# Patient Record
Sex: Female | Born: 1990 | Hispanic: Yes | Marital: Single
Health system: Southern US, Community
[De-identification: ages and names within clinical notes are randomized; demographics above are authoritative.]

---

## 2016-11-18 ENCOUNTER — Ambulatory Visit (INDEPENDENT_AMBULATORY_CARE_PROVIDER_SITE_OTHER): Payer: Self-pay | Admitting: General Practice

## 2016-11-18 DIAGNOSIS — O208 Other hemorrhage in early pregnancy: Principal | ICD-10-CM

## 2016-11-18 DIAGNOSIS — O209 Hemorrhage in early pregnancy, unspecified: Secondary | ICD-10-CM

## 2016-11-18 DIAGNOSIS — Z3201 Encounter for pregnancy test, result positive: Secondary | ICD-10-CM

## 2016-11-18 LAB — POCT PREGNANCY, URINE: PREG TEST UR: POSITIVE — AB

## 2016-11-18 NOTE — Progress Notes (Signed)
Patient here for UPT today. UPT +. Patient reports first positive home test in August. Patient reports starting period 11/13/16. Per chart review, [facility] for [facility]. Patient left AMA prior to being seen. Patient does not seem to be accurate historian when questioned about menstrual history. Spoke with Dr Adrian BlackwaterStinson who recommends bhcg today and Thursday, does not need to be stat. Discussed with patient. Patient verbalized understanding & had no questions

## 2016-11-19 LAB — BETA HCG QUANT (REF LAB): HCG QUANT: 57 m[IU]/mL

## 2016-11-20 ENCOUNTER — Ambulatory Visit: Payer: Self-pay | Admitting: General Practice

## 2016-11-20 DIAGNOSIS — O209 Hemorrhage in early pregnancy, unspecified: Secondary | ICD-10-CM

## 2016-11-21 ENCOUNTER — Telehealth: Payer: Self-pay | Admitting: General Practice

## 2016-11-21 DIAGNOSIS — O3680X0 Pregnancy with inconclusive fetal viability, not applicable or unspecified: Secondary | ICD-10-CM

## 2016-11-21 LAB — BETA HCG QUANT (REF LAB): HCG QUANT: 66 m[IU]/mL

## 2016-11-21 NOTE — Telephone Encounter (Signed)
-----   Message from Levie HeritageJacob J Stinson, DO sent at 11/21/2016 12:46 PM EDT ----- Please schedule for US - inappropriately rising quants, possible nonviable pregnancy vs retained products?

## 2016-11-21 NOTE — Telephone Encounter (Signed)
Scheduled ultrasound for 11/1 @ 1pm. Called patient, no answer- left message stating we are trying to reach you regarding results & an appt scheduled for next Thursday at 1pm please call us back. Also left same message on emergency contact's voicemail. Will send letter

## 2016-11-25 ENCOUNTER — Telehealth: Payer: Self-pay | Admitting: *Deleted

## 2016-11-25 NOTE — Telephone Encounter (Signed)
Patient left 3 messages requesting her test results.

## 2016-11-27 ENCOUNTER — Ambulatory Visit (HOSPITAL_COMMUNITY): Payer: Self-pay

## 2016-11-27 ENCOUNTER — Ambulatory Visit (HOSPITAL_COMMUNITY)
Admission: RE | Admit: 2016-11-27 | Discharge: 2016-11-27 | Disposition: A | Discharge status: Home / Self Care | Payer: Self-pay | Source: Ambulatory Visit | Attending: Family Medicine | Admitting: Family Medicine

## 2016-11-27 ENCOUNTER — Ambulatory Visit: Payer: Self-pay

## 2016-11-27 DIAGNOSIS — Z3490 Encounter for supervision of normal pregnancy, unspecified, unspecified trimester: Secondary | ICD-10-CM | POA: Insufficient documentation

## 2016-11-27 DIAGNOSIS — O3680X0 Pregnancy with inconclusive fetal viability, not applicable or unspecified: Secondary | ICD-10-CM

## 2016-11-27 NOTE — Telephone Encounter (Signed)
Pt concerns addressed.

## 2016-11-27 NOTE — Progress Notes (Addendum)
Pt here today for OB US results.  Pt denies any pain or bleeding.  Notified Dr. Burnice LoganHarraway- Ashley Lowery pt results recommendation for pt to come back in two weeks for beta lab (not a stat) and if have any symptoms to please given the office a call.  Pt stated understanding with no further questions.  Attestation of Attending Supervision of RN: Evaluation and management procedures were performed by the nurse under my supervision and collaboration.  I have reviewed the nursing note and chart, and I agree with the management and plan.  Carolyn L. Harraway-Smith, M.D., Evern CoreFACOG

## 2016-12-11 ENCOUNTER — Other Ambulatory Visit: Payer: Self-pay

## 2016-12-11 DIAGNOSIS — O039 Complete or unspecified spontaneous abortion without complication: Secondary | ICD-10-CM

## 2016-12-12 LAB — BETA HCG QUANT (REF LAB): HCG QUANT: 13 m[IU]/mL

## 2018-11-03 ENCOUNTER — Other Ambulatory Visit: Payer: Self-pay

## 2018-11-03 DIAGNOSIS — Z20822 Contact with and (suspected) exposure to covid-19: Secondary | ICD-10-CM

## 2018-11-04 LAB — NOVEL CORONAVIRUS, NAA: SARS-CoV-2, NAA: DETECTED — AB

## 2018-12-12 IMAGING — US US OB COMP LESS 14 WK
1 series · 15 of 28 positions shown · non-contrast
Comparison: None.

CLINICAL DATA: Inappropriately rising quantitative beta HCG levels
. Unsure of LMP. Pregnancy of unknown anatomic location.

EXAM:
OBSTETRIC <14 WK US AND TRANSVAGINAL OB US
TECHNIQUE: Both transabdominal and transvaginal ultrasound examinations were
performed for complete evaluation of the gestation as well as the
maternal uterus, adnexal regions, and pelvic cul-de-sac.
Transvaginal technique was performed to assess early pregnancy.

[Series 1: us ob comp less 14 wk · 15 of 61 slices shown]
[im 1/61]
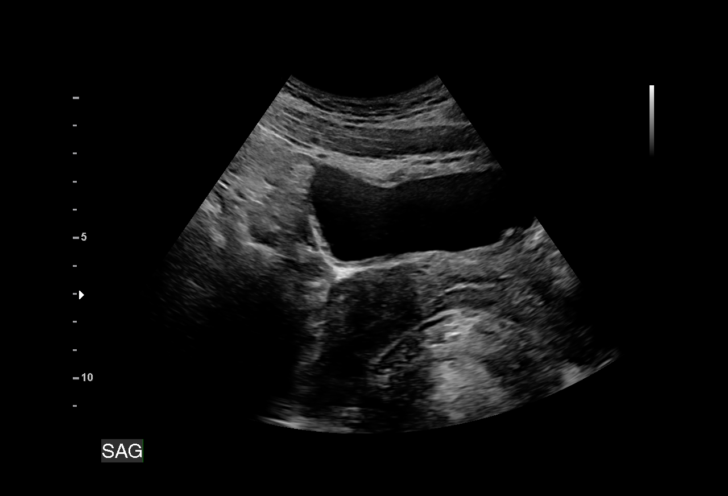
[im 5/61]
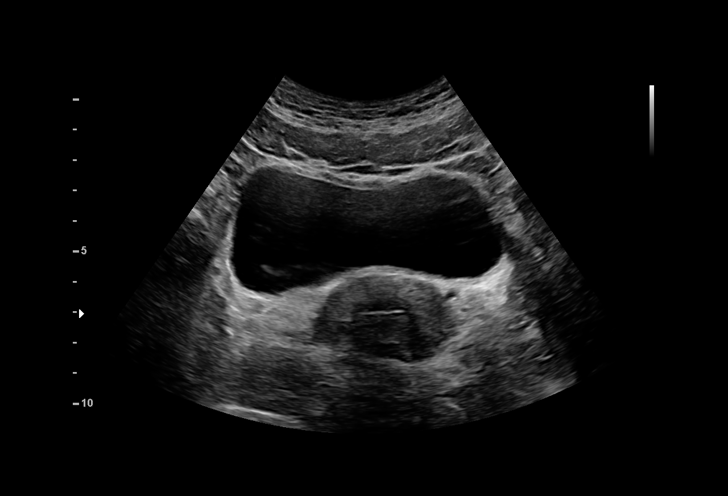
[im 9/61]
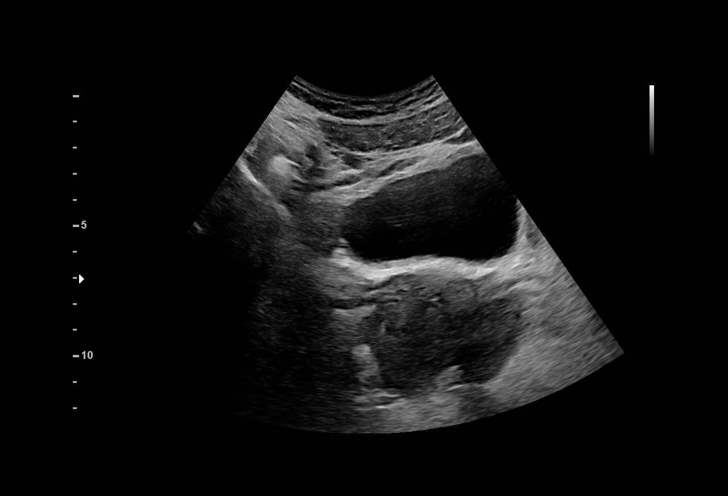
[im 14/61]
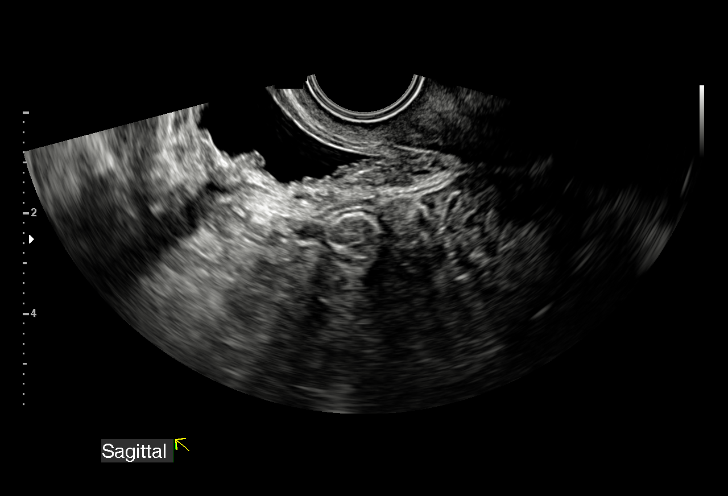
[im 18/61]
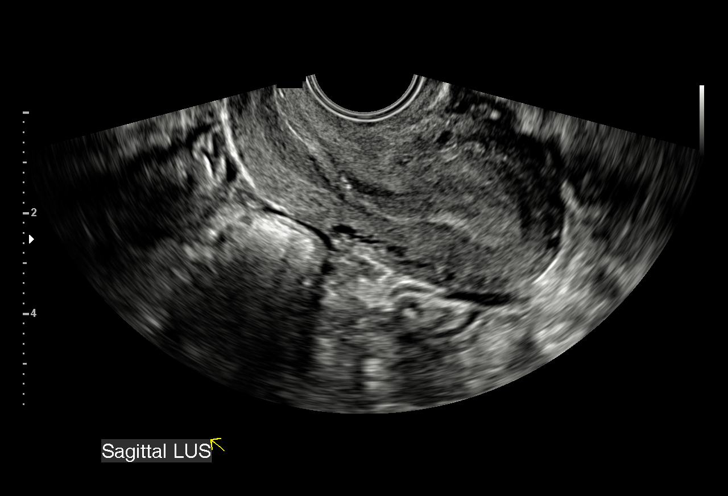
[im 23/61]
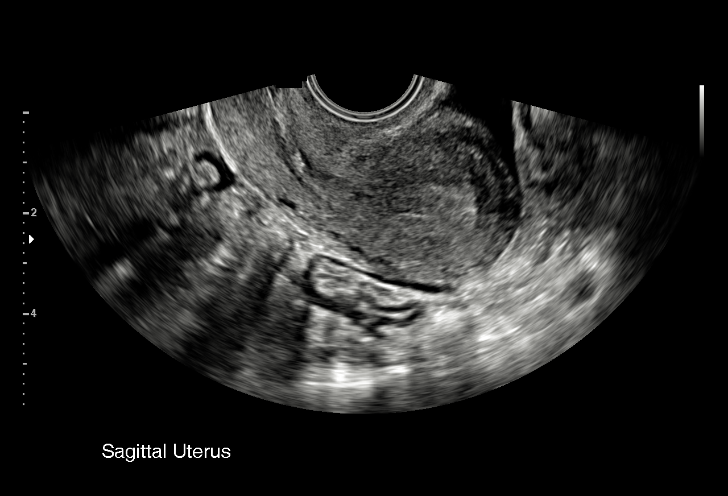
[im 27/61]
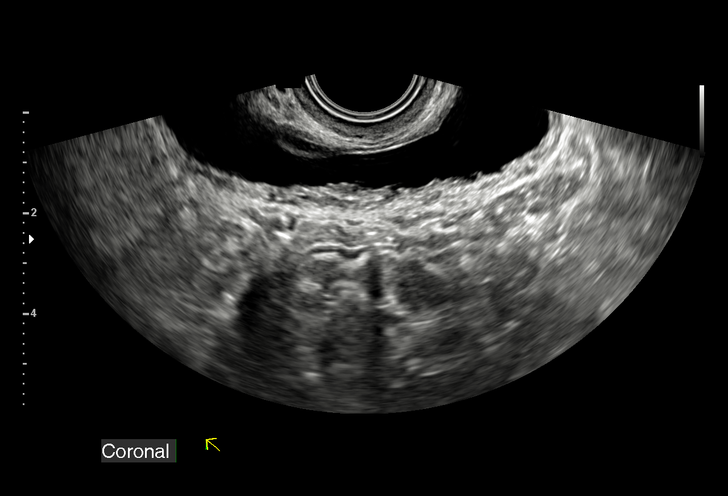
[im 32/61]
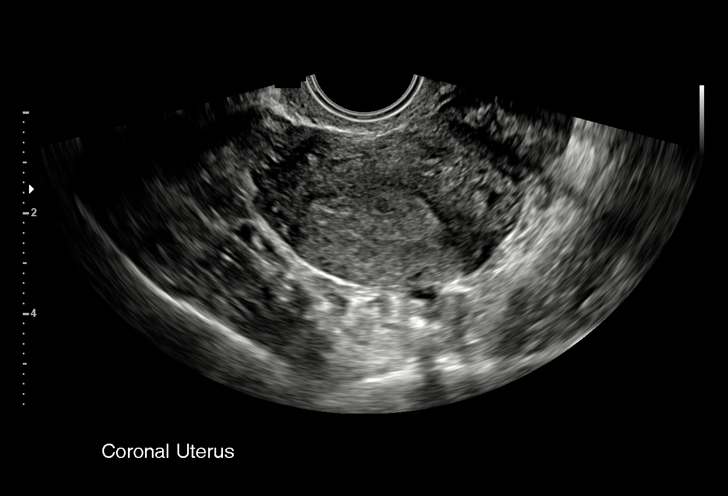
[im 34/61]
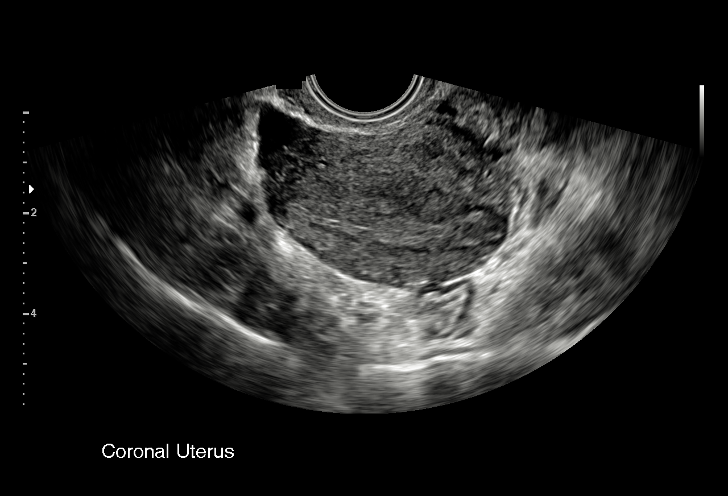
[im 38/61]
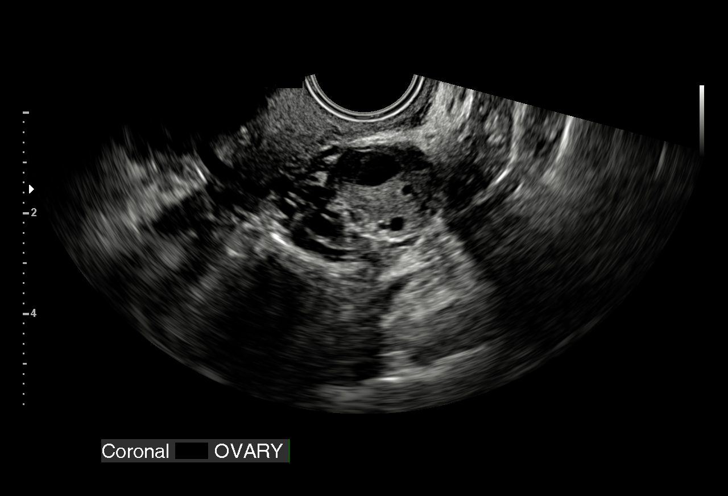
[im 43/61]
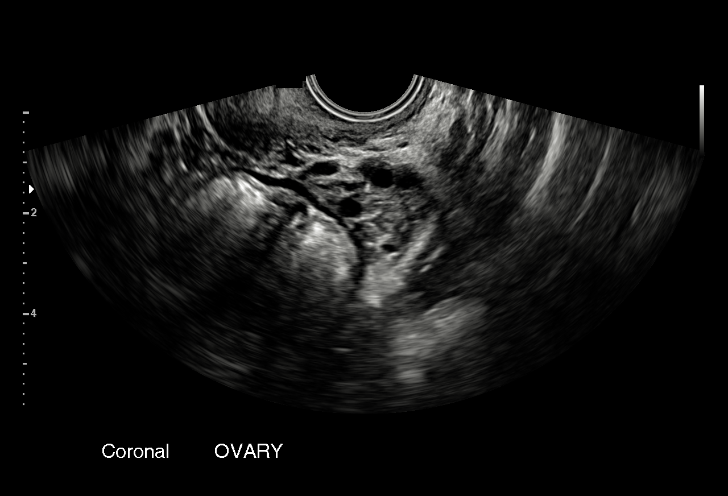
[im 47/61]
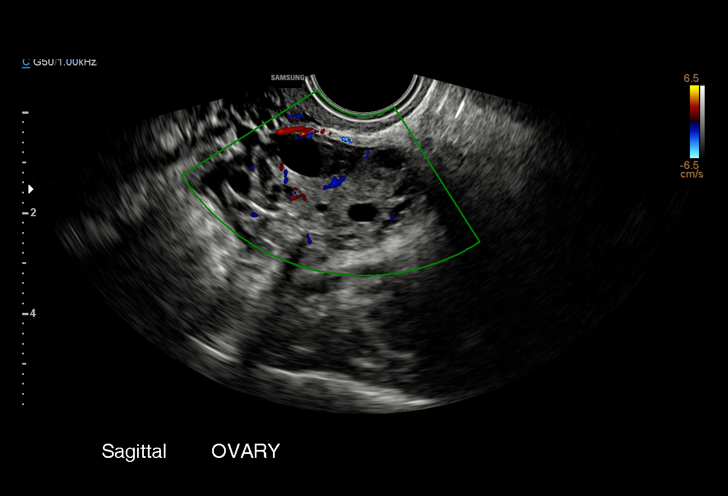
[im 52/61]
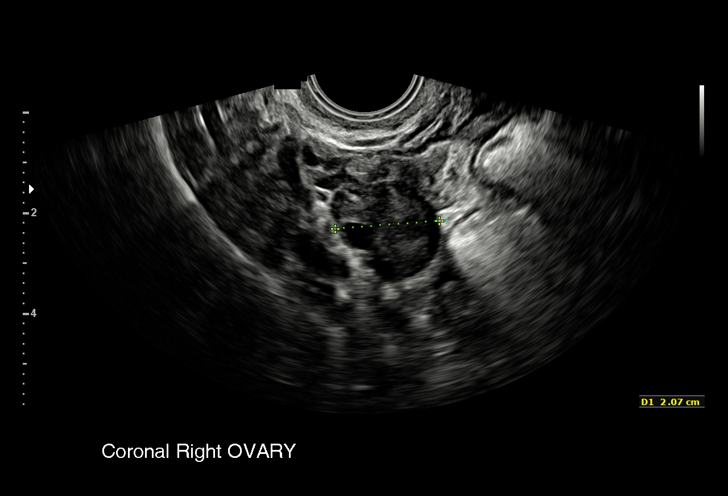
[im 56/61]
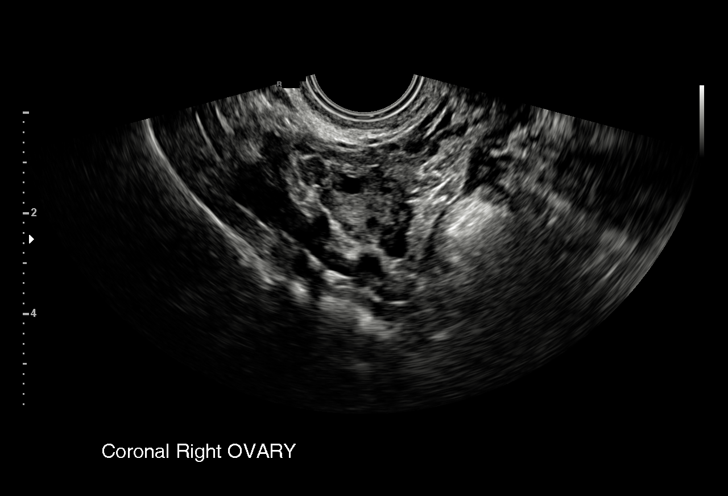
[im 61/61]
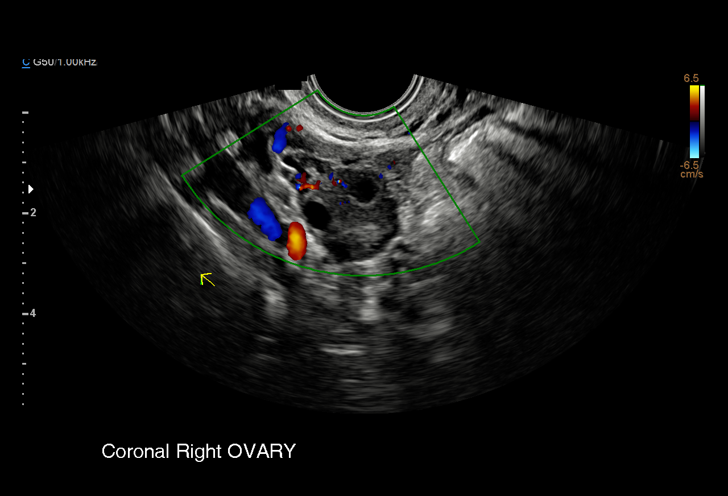

[15 of 28 positions shown; findings below may reference images not displayed]

FINDINGS: Intrauterine gestational sac: None

Maternal uterus/adnexae: Retroverted uterus. No fibroids identified.
Endometrial thickness measures 11 mm. Normal appearance of both
ovaries. No adnexal mass identified. Tiny amount of simple free
fluid seen in cul-de-sac.
IMPRESSION: Pregnancy of unknown anatomic location (no intrauterine gestational
sac or adnexal mass identified). Differential diagnosis includes
recent spontaneous abortion, IUP too early to visualize, and
non-visualized ectopic pregnancy. Recommend follow up of
quantitative B-HCG levels, and follow up US as clinically warranted.
